# Patient Record
Sex: Male | Born: 1970 | Race: White | Hispanic: No | Marital: Married | Smoking: Never smoker
Health system: Southern US, Community
[De-identification: ages and names within clinical notes are randomized; demographics above are authoritative.]

## PROBLEM LIST (undated history)

## (undated) DIAGNOSIS — I10 Essential (primary) hypertension: Secondary | ICD-10-CM

## (undated) HISTORY — PX: WISDOM TOOTH EXTRACTION: SHX21

---

## 2005-05-29 ENCOUNTER — Ambulatory Visit (HOSPITAL_COMMUNITY): Admission: RE | Admit: 2005-05-29 | Discharge: 2005-05-29 | Payer: Self-pay | Admitting: Family Medicine

## 2017-05-17 ENCOUNTER — Emergency Department (HOSPITAL_COMMUNITY)
Admission: EM | Admit: 2017-05-17 | Discharge: 2017-05-17 | Disposition: A | Discharge status: Home / Self Care | Payer: Commercial Managed Care - PPO | Attending: Emergency Medicine | Admitting: Emergency Medicine

## 2017-05-17 ENCOUNTER — Encounter (HOSPITAL_COMMUNITY): Payer: Self-pay | Admitting: Emergency Medicine

## 2017-05-17 ENCOUNTER — Emergency Department (HOSPITAL_COMMUNITY): Payer: Commercial Managed Care - PPO

## 2017-05-17 ENCOUNTER — Other Ambulatory Visit: Payer: Self-pay

## 2017-05-17 DIAGNOSIS — Y999 Unspecified external cause status: Secondary | ICD-10-CM | POA: Insufficient documentation

## 2017-05-17 DIAGNOSIS — Y929 Unspecified place or not applicable: Secondary | ICD-10-CM | POA: Insufficient documentation

## 2017-05-17 DIAGNOSIS — S7002XA Contusion of left hip, initial encounter: Secondary | ICD-10-CM

## 2017-05-17 DIAGNOSIS — S79912A Unspecified injury of left hip, initial encounter: Secondary | ICD-10-CM | POA: Diagnosis present

## 2017-05-17 DIAGNOSIS — S0001XA Abrasion of scalp, initial encounter: Secondary | ICD-10-CM | POA: Insufficient documentation

## 2017-05-17 DIAGNOSIS — R079 Chest pain, unspecified: Secondary | ICD-10-CM | POA: Diagnosis not present

## 2017-05-17 DIAGNOSIS — Y939 Activity, unspecified: Secondary | ICD-10-CM | POA: Diagnosis not present

## 2017-05-17 DIAGNOSIS — I10 Essential (primary) hypertension: Secondary | ICD-10-CM | POA: Insufficient documentation

## 2017-05-17 DIAGNOSIS — T07XXXA Unspecified multiple injuries, initial encounter: Secondary | ICD-10-CM

## 2017-05-17 DIAGNOSIS — S60511A Abrasion of right hand, initial encounter: Secondary | ICD-10-CM | POA: Diagnosis not present

## 2017-05-17 HISTORY — DX: Essential (primary) hypertension: I10

## 2017-05-17 MED ORDER — IBUPROFEN 600 MG PO TABS: 600.0000 mg | ORAL_TABLET | Freq: Four times a day (QID) | ORAL | 0 refills | Status: AC | PRN

## 2017-05-17 MED ORDER — METHOCARBAMOL 500 MG PO TABS: 1000.0000 mg | ORAL_TABLET | Freq: Three times a day (TID) | ORAL | 0 refills | Status: AC | PRN

## 2017-05-17 MED ORDER — METHOCARBAMOL 500 MG PO TABS
1000.0000 mg | ORAL_TABLET | Freq: Once | ORAL | Status: AC
  Administered 2017-05-17: 1000 mg via ORAL
  Filled 2017-05-17: qty 2

## 2017-05-17 MED ORDER — KETOROLAC TROMETHAMINE 60 MG/2ML IM SOLN
60.0000 mg | Freq: Once | INTRAMUSCULAR | Status: AC
  Administered 2017-05-17: 60 mg via INTRAMUSCULAR
  Filled 2017-05-17: qty 2
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
## 2017-06-22 DIAGNOSIS — Z0000 Encounter for general adult medical examination without abnormal findings: Secondary | ICD-10-CM | POA: Diagnosis not present

## 2017-06-22 DIAGNOSIS — R7309 Other abnormal glucose: Secondary | ICD-10-CM | POA: Diagnosis not present

## 2017-06-22 DIAGNOSIS — K219 Gastro-esophageal reflux disease without esophagitis: Secondary | ICD-10-CM | POA: Diagnosis not present

## 2017-06-22 DIAGNOSIS — I10 Essential (primary) hypertension: Secondary | ICD-10-CM | POA: Diagnosis not present

## 2017-06-22 DIAGNOSIS — Z1389 Encounter for screening for other disorder: Secondary | ICD-10-CM | POA: Diagnosis not present

## 2019-10-04 IMAGING — DX DG CHEST 2V
2 series · 2 of 2 positions shown · non-contrast
Comparison: No recent.

CLINICAL DATA: Chest pain.

EXAM:
CHEST  2 VIEW

[chest pa]
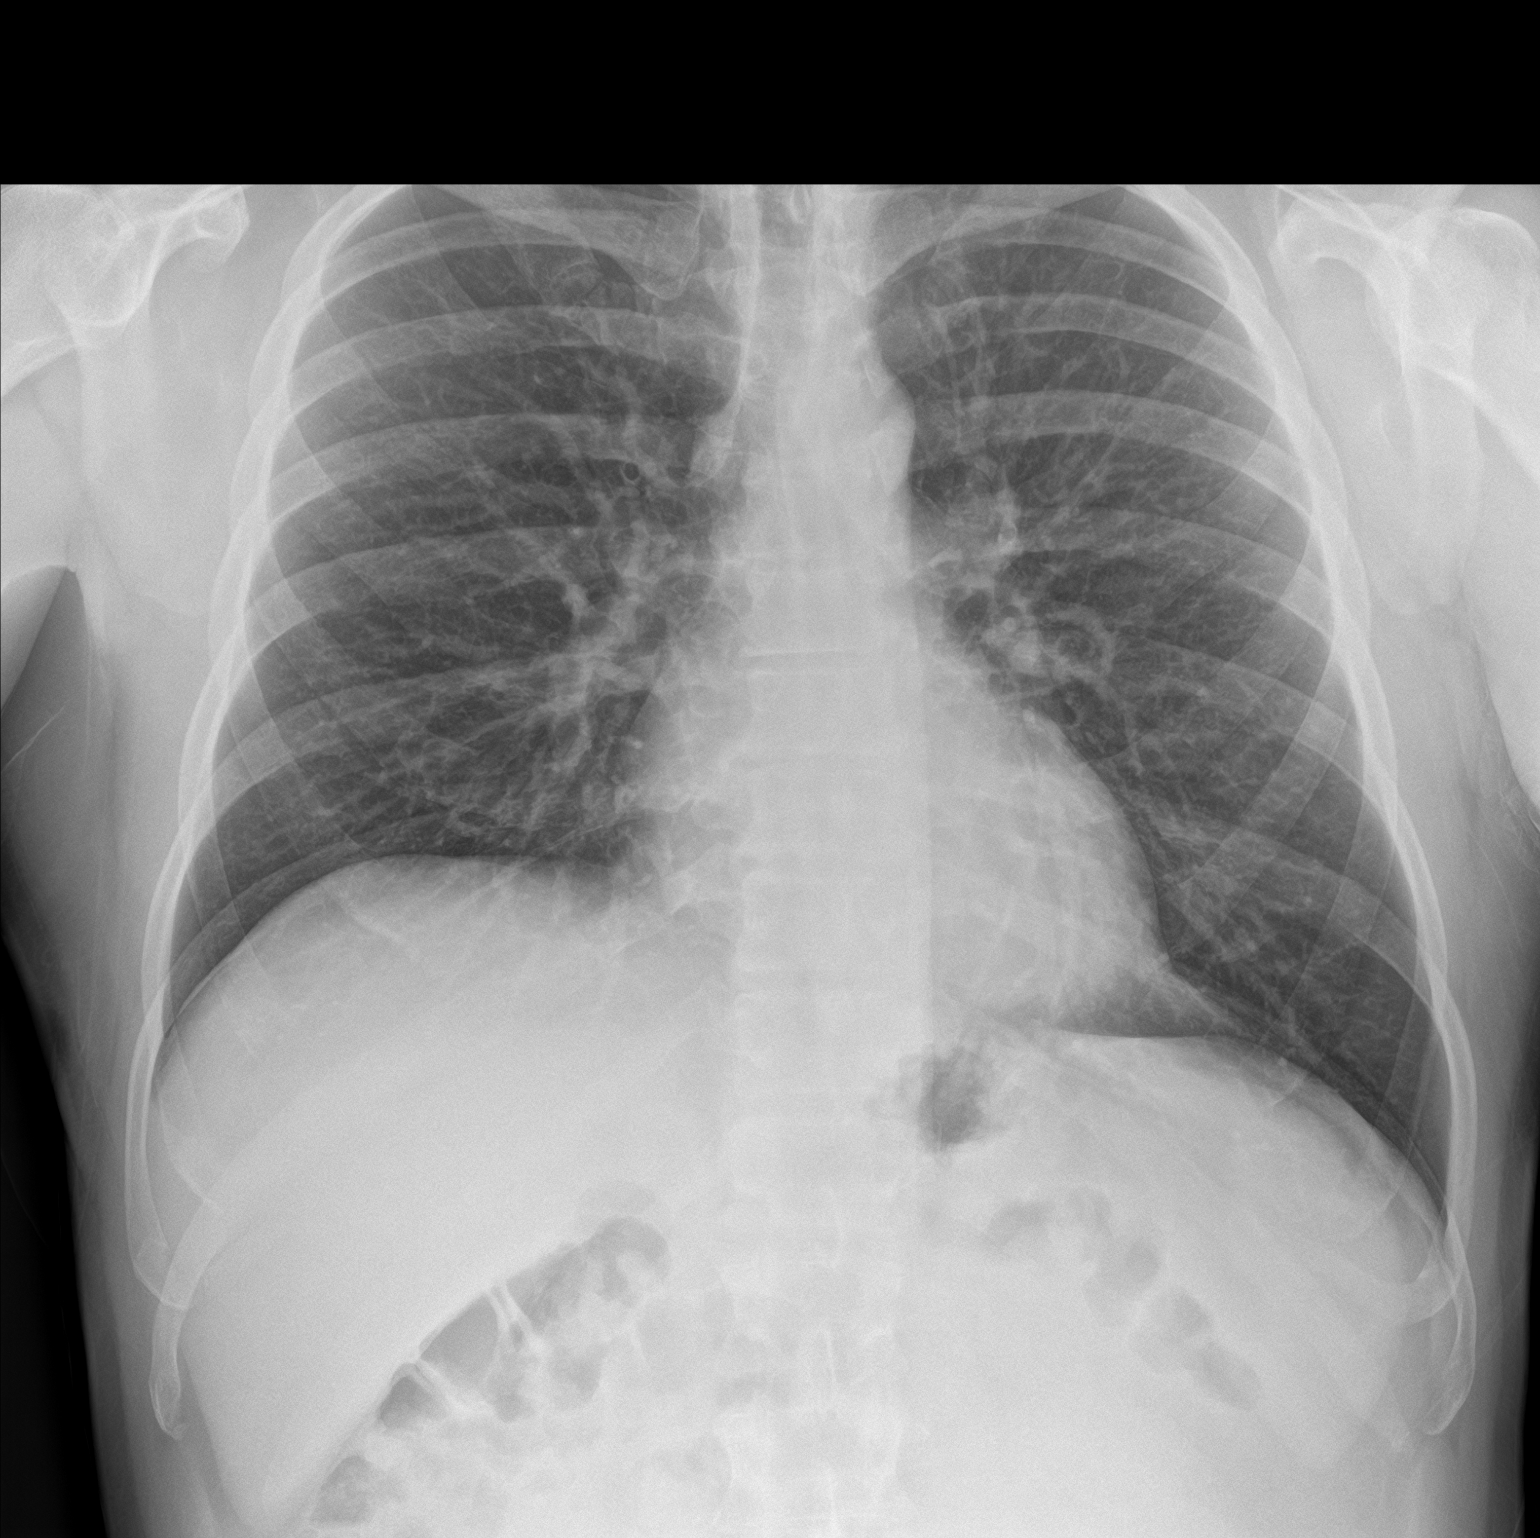

[chest lat]
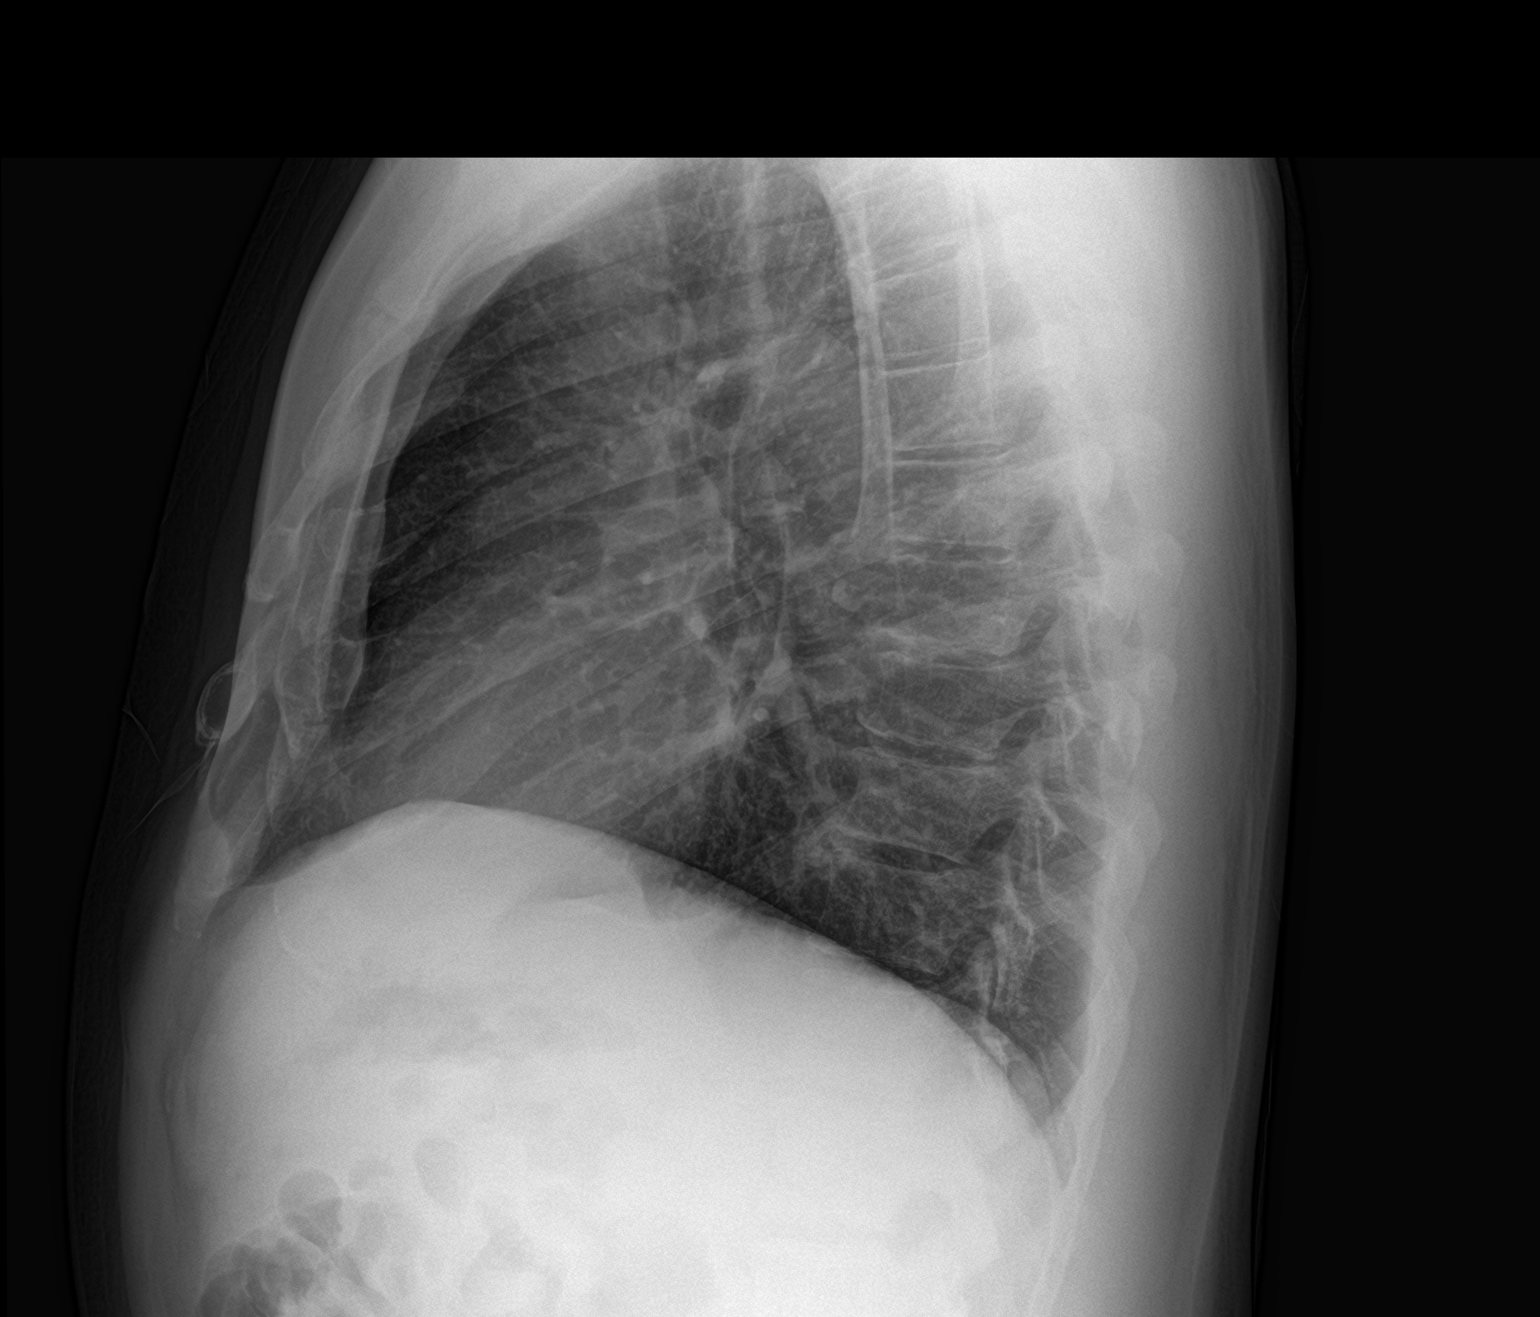

[2 of 2 positions shown; findings below may reference images not displayed]

FINDINGS: Mediastinum and hilar structures normal. Lungs are clear. No pleural
effusion or pneumothorax. Heart size normal. No acute bony
abnormality.
IMPRESSION: No acute cardiopulmonary disease.
